# Patient Record
Sex: Female | Born: 1995 | Race: Black or African American | Hispanic: No | Marital: Married | Smoking: Never smoker
Health system: Southern US, Community
[De-identification: ages and names within clinical notes are randomized; demographics above are authoritative.]

## PROBLEM LIST (undated history)

## (undated) ENCOUNTER — Inpatient Hospital Stay: Payer: Self-pay

## (undated) DIAGNOSIS — D571 Sickle-cell disease without crisis: Secondary | ICD-10-CM

## (undated) DIAGNOSIS — Z789 Other specified health status: Secondary | ICD-10-CM

## (undated) HISTORY — DX: Sickle-cell disease without crisis: D57.1

---
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
## 2017-03-23 DIAGNOSIS — H5213 Myopia, bilateral: Secondary | ICD-10-CM | POA: Diagnosis not present

## 2018-02-12 ENCOUNTER — Other Ambulatory Visit: Payer: Self-pay

## 2018-02-12 ENCOUNTER — Encounter: Payer: Self-pay | Admitting: Emergency Medicine

## 2018-02-12 ENCOUNTER — Emergency Department: Payer: Self-pay

## 2018-02-12 ENCOUNTER — Emergency Department
Admission: EM | Admit: 2018-02-12 | Discharge: 2018-02-12 | Disposition: A | Discharge status: Home / Self Care | Payer: Self-pay | Attending: Emergency Medicine | Admitting: Emergency Medicine

## 2018-02-12 DIAGNOSIS — J101 Influenza due to other identified influenza virus with other respiratory manifestations: Secondary | ICD-10-CM

## 2018-02-12 LAB — INFLUENZA PANEL BY PCR (TYPE A & B)
INFLBPCR: POSITIVE — AB
Influenza A By PCR: NEGATIVE

## 2018-02-12 LAB — GROUP A STREP BY PCR: Group A Strep by PCR: NOT DETECTED

## 2018-02-12 MED ORDER — GUAIFENESIN-CODEINE 100-10 MG/5ML PO SOLN
5.0000 mL | Freq: Four times a day (QID) | ORAL | 0 refills | Status: DC | PRN
Start: 2018-02-12 — End: 2018-11-17

## 2018-02-12 NOTE — ED Triage Notes (Signed)
Pt presents to ED c/o sore throat, nasal congestion, and cough x3 days. States fiancee has same symptoms.

## 2018-02-12 NOTE — Discharge Instructions (Signed)
Follow-up with your primary care provider or [facility] care if any continued problems.  Began Robitussin-AC as needed for cough and congestion.  The time window for Tamiflu has already passed.  Take Tylenol or ibuprofen as needed for body aches or fever.  Increase fluids.

## 2018-02-12 NOTE — ED Provider Notes (Signed)
[facility] Emergency Department Provider Note  ____________________________________________   First MD Initiated Contact with Patient 02/12/18 1244     (approximate)  I have reviewed the triage vital signs and the nursing notes.   HISTORY  Chief Complaint Sore Throat and Cough   HPI Sharon Ball is a 22 y.o. female presents to the ED with complaint of sore throat, congestion, cough for the last 4 days.  Patient states that her fianc has same symptoms.  She has had subjective fever and body aches.  Patient denies any other symptoms.  She rates her pain as an 8 out of 10.   Past Medical History:  Diagnosis Date  . Medical history non-contributory     Patient Active Problem List   Diagnosis Date Noted  . Post term pregnancy 08/01/2016  . Indication for care in labor and delivery, antepartum 06/24/2016  . Pelvic pain affecting pregnancy 06/17/2016  . Sickle cell trait [facility](HCC)     Past Surgical History:  Procedure Laterality Date  . CESAREAN SECTION N/A 08/03/2016   Procedure: CESAREAN SECTION;  Surgeon: Christeen DouglasBeasley, Bethany, MD;  Location: [facility] ORS;  Service: Obstetrics;  Laterality: N/A;    Prior to Admission medications   Medication Sig Start Date End Date Taking? Authorizing Provider  acetaminophen (TYLENOL) 500 MG tablet Take 2 tablets (1,000 mg total) by mouth every 8 (eight) hours. 08/05/16   Ward, Elenora Fenderhelsea C, MD  guaiFENesin-codeine 100-10 MG/5ML syrup Take 5 mLs by mouth every 6 (six) hours as needed. 02/12/18   Tommi RumpsSummers, Rhonda L, PA-C  ibuprofen (ADVIL,MOTRIN) 600 MG tablet Take 1 tablet (600 mg total) by mouth every 6 (six) hours. 08/05/16   Ward, Elenora Fenderhelsea C, MD  oxyCODONE (OXY IR/ROXICODONE) 5 MG immediate release tablet Take 1 tablet (5 mg total) by mouth every 4 (four) hours as needed (pain scale 4-7). 08/05/16   Ward, Elenora Fenderhelsea C, MD  Prenatal Vit-Fe Fumarate-FA (MULTIVITAMIN-PRENATAL) 27-0.8 MG TABS tablet Take 1 tablet by mouth daily at 12  noon.    [provider]    Allergies Patient has no known allergies.  Family History  Problem Relation Age of Onset  . Sickle cell anemia Father   . Cancer Father   . Diabetes Maternal Grandmother     Social History Social History   Tobacco Use  . Smoking status: Never Smoker  . Smokeless tobacco: Never Used  Substance Use Topics  . Alcohol use: No  . Drug use: No    Review of Systems Constitutional: Subjective fever/chills Eyes: No visual changes. ENT: Positive sore throat.  Positive nasal congestion. Cardiovascular: Denies chest pain. Respiratory: Denies shortness of breath.  Positive cough. Gastrointestinal: No abdominal pain.  No nausea, no vomiting.  No diarrhea.  Musculoskeletal: Positive for muscle aches. Skin: Negative for rash. Neurological: Negative for headaches, focal weakness or numbness. ____________________________________________   PHYSICAL EXAM:  VITAL SIGNS: ED Triage Vitals  Enc Vitals Group     BP 02/12/18 1215 129/80     Pulse Rate 02/12/18 1215 (!) 117     Resp 02/12/18 1215 (!) 22     Temp 02/12/18 1215 100.2 F (37.9 C)     Temp Source 02/12/18 1215 Oral     SpO2 02/12/18 1215 99 %     Weight 02/12/18 1221 220 lb (99.8 kg)     Height 02/12/18 1221 5\' 8"  (1.727 m)     Head Circumference --      Peak Flow --  Pain Score 02/12/18 1221 8     Pain Loc --      Pain Edu? --      Excl. in GC? --    Constitutional: Alert and oriented. Well appearing and in no acute distress. Eyes: Conjunctivae are normal.  Head: Atraumatic. Nose: Mild congestion/rhinnorhea. Mouth/Throat: Mucous membranes are moist.  Oropharynx non-erythematous. Neck: No stridor.   Hematological/Lymphatic/Immunilogical: No cervical lymphadenopathy. Cardiovascular: Normal rate, regular rhythm. Grossly normal heart sounds.  Good peripheral circulation. Respiratory: Normal respiratory effort.  No retractions. Lungs CTAB. Gastrointestinal: Soft and nontender.  No distention.  Musculoskeletal: Moves upper and lower extremities without any difficulty.  Normal gait was noted. Neurologic:  Normal speech and language. No gross focal neurologic deficits are appreciated. No gait instability. Skin:  Skin is warm, dry and intact. No rash noted. Psychiatric: Mood and affect are normal. Speech and behavior are normal.  ____________________________________________   LABS (all labs ordered are listed, but only abnormal results are displayed)  Labs Reviewed  INFLUENZA PANEL BY PCR (TYPE A & B) - Abnormal; Notable for the following components:      Result Value   Influenza B By PCR POSITIVE (*)    All other components within normal limits  GROUP A STREP BY PCR    RADIOLOGY   Official radiology report(s): Dg Chest 2 View  Result Date: 02/12/2018 CLINICAL DATA:  Productive cough and sore throat for 3 days. EXAM: CHEST - 2 VIEW COMPARISON:  None. FINDINGS: The heart size and mediastinal contours are within normal limits. Both lungs are clear. No pleural effusion or pneumothorax. The visualized skeletal structures are unremarkable. IMPRESSION: Normal chest radiographs. Electronically Signed   By: Amie Portlandavid  Ormond M.D.   On: 02/12/2018 12:37  ____________________________________________   PROCEDURES  Procedure(s) performed: None  Procedures  Critical Care performed: No  ____________________________________________   INITIAL IMPRESSION / ASSESSMENT AND PLAN / ED COURSE  As part of my medical decision making, I reviewed the following data within the electronic MEDICAL RECORD NUMBER Notes from prior ED visits and [place] Controlled Substance Database  Patient presents to the ED with complaint of sore throat, cough, congestion and body aches for the last 4 days.  Patient states that her fianc has similar symptoms.  She is also had subjective fever.  Physical exam is consistent with viral respiratory symptoms.  Patient is positive for influenza B.  Patient is  aware that it is too late to start Tamiflu.  She was given a prescription for guaifenesin with codeine to take every 6 hours as needed for cough.  She will increase fluids and continue to take over-the-counter medication to control her fever and body aches.  She is encouraged to follow-up with her PCP or [facility] if any continued problems.  ____________________________________________   FINAL CLINICAL IMPRESSION(S) / ED DIAGNOSES  Final diagnoses:  Influenza B     ED Discharge Orders         Ordered    guaiFENesin-codeine 100-10 MG/5ML syrup  Every 6 hours PRN     02/12/18 1318           Note:  This document was prepared using Dragon voice recognition software and may include unintentional dictation errors.    Tommi RumpsSummers, Rhonda L, PA-C 02/12/18 1328    Emily FilbertWilliams, Jonathan E, MD 02/12/18 [phone]

## 2018-08-17 ENCOUNTER — Telehealth: Payer: Self-pay | Admitting: Family Medicine

## 2018-08-17 NOTE — Telephone Encounter (Signed)
TC with patient. Patient didn't understand the difference between ovulation tests and PT tests. Educated patient; verbalized understanding. Aileen Fass, RN

## 2018-08-31 DIAGNOSIS — E669 Obesity, unspecified: Secondary | ICD-10-CM

## 2018-08-31 DIAGNOSIS — Z6834 Body mass index (BMI) 34.0-34.9, adult: Secondary | ICD-10-CM

## 2018-11-17 ENCOUNTER — Encounter: Payer: Self-pay | Admitting: Physician Assistant

## 2018-11-17 ENCOUNTER — Ambulatory Visit (INDEPENDENT_AMBULATORY_CARE_PROVIDER_SITE_OTHER): Payer: Medicaid Other | Admitting: Physician Assistant

## 2018-11-17 ENCOUNTER — Other Ambulatory Visit: Payer: Self-pay

## 2018-11-17 ENCOUNTER — Other Ambulatory Visit (HOSPITAL_COMMUNITY)
Admission: RE | Admit: 2018-11-17 | Discharge: 2018-11-17 | Disposition: A | Discharge status: Home / Self Care | Payer: Medicaid Other | Source: Ambulatory Visit | Attending: Physician Assistant | Admitting: Physician Assistant

## 2018-11-17 VITALS — BP 115/84 | HR 166 | Temp 97.3°F | Resp 16 | Ht 62.0 in | Wt 224.0 lb

## 2018-11-17 DIAGNOSIS — Z7251 High risk heterosexual behavior: Secondary | ICD-10-CM | POA: Insufficient documentation

## 2018-11-17 DIAGNOSIS — Z113 Encounter for screening for infections with a predominantly sexual mode of transmission: Secondary | ICD-10-CM

## 2018-11-17 DIAGNOSIS — D573 Sickle-cell trait: Secondary | ICD-10-CM

## 2018-11-17 NOTE — Patient Instructions (Signed)
Pregnancy and Anemia  Anemia is a condition in which the concentration of red blood cells, or hemoglobin, in the blood is below normal. Hemoglobin is a substance in red blood cells that carries oxygen to the tissues of the body. Anemia results when enough oxygen does not reach these tissues. Anemia is common during pregnancy because the woman's body needs more blood volume and blood cells to provide nutrition to the fetus. The fetus needs iron and folic acid as it is developing. Your body may not produce enough red blood cells because of this. Also, during pregnancy, the liquid part of the blood (plasma) increases by about 30-50%, and the red blood cells increase by only 20%. This lowers the concentration of the red blood cells and creates a natural anemia-like situation. What are the causes? The most common cause of anemia during pregnancy is not having enough iron in the body to make red blood cells (iron deficiency anemia). Other causes may include:  Folic acid deficiency.  Vitamin B12 deficiency.  Certain prescription or over-the-counter medicines.  Certain medical conditions or infections that destroy red blood cells.  A low platelet count and bleeding caused by antibodies that go through the placenta to the fetus from the mother's blood. What are the signs or symptoms? Mild anemia may not be noticeable. If it becomes severe, symptoms may include:  Feeling tired (fatigue).  Shortness of breath, especially during activity.  Weakness.  Fainting.  Pale looking skin.  Headaches.  A fast or irregular heartbeat (palpitations).  Dizziness. How is this diagnosed? This condition may be diagnosed based on:  Your medical history and a physical exam.  Blood tests. How is this treated? Treatment for anemia during pregnancy depends on the cause of the anemia. Treatment can include:  Dietary changes.  Supplements of iron, vitamin B12, or folic acid.  A blood transfusion. This may  be needed if anemia is severe.  Hospitalization. This may be needed if there is a lot of blood loss or severe anemia. Follow these instructions at home:  Follow recommendations from your dietitian or health care provider about changing your diet.  Increase your vitamin C intake. This will help the stomach absorb more iron. Some foods that are high in vitamin C include: ? Oranges. ? Peppers. ? Tomatoes. ? Mangoes.  Eat a diet rich in iron. This would include foods such as: ? Liver. ? Beef. ? Eggs. ? Whole grains. ? Spinach. ? Dried fruit.  Take iron and vitamins as told by your health care provider.  Eat green leafy vegetables. These are a good source of folic acid.  Keep all follow-up visits as told by your health care provider. This is important. Contact a health care provider if:  You have frequent or lasting headaches.  You look pale.  You bruise easily. Get help right away if:  You have extreme weakness, shortness of breath, or chest pain.  You become dizzy or have trouble concentrating.  You have heavy vaginal bleeding.  You develop a rash.  You have bloody or black, tarry stools.  You faint.  You vomit up blood.  You vomit repeatedly.  You have abdominal pain.  You have a fever.  You are dehydrated. Summary  Anemia is a condition in which the concentration of red blood cells or hemoglobin in the blood is below normal.  Anemia is common during pregnancy because the woman's body needs more blood volume and blood cells to provide nutrition to the fetus.  The most   common cause of anemia during pregnancy is not having enough iron in the body to make red blood cells (iron deficiency anemia).  Mild anemia may not be noticeable. If it becomes severe, symptoms may include feeling tired and weak. This information is not intended to replace advice given to you by your health care provider. Make sure you discuss any questions you have with your health care  provider. Document Released: 02/01/2000 Document Revised: 05/28/2018 Document Reviewed: 03/11/2016 Elsevier Patient Education  2020 [employer]  

## 2018-11-17 NOTE — Progress Notes (Signed)
Patient: Sharon Ball, Female    DOB: 1995/11/19, 23 y.o.   MRN: [id number] Visit Date: 11/17/2018  Today's Provider: Trinna Post, PA-C   Chief Complaint  Patient presents with  . New Patient (Initial Visit)   Subjective:     Annual physical exam Sharon Ball is a 23 y.o. female who presents today for health maintenance and complete physical. She feels well. She reports exercising no. She reports she is sleeping well.  Lives with her soon to be husband and 23 year old daughter in [place]. 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  No medical issues she is being treated for.  Sexually active, not using birth control. Currently taking prenatal vitamin gummies. No smoking and no alcohol. She is trying to become pregnant.  Would like pregnancy test. Her LMP was 5 days ago. Has not taken home pregnancy.  -----------------------------------------------------------------   Review of Systems  Constitutional: Negative.   HENT: Negative.   Eyes: Negative.   Respiratory: Negative.   Cardiovascular: Negative.   Gastrointestinal: Negative.   Endocrine: Negative.   Genitourinary: Negative.   Musculoskeletal: Negative.   Skin: Negative.   Allergic/Immunologic: Negative.   Neurological: Negative.   Hematological: Negative.   Psychiatric/Behavioral: Negative.     Social History      She  reports that she has never smoked. She has never used smokeless tobacco. She reports that she does not drink alcohol or use drugs.       Social History   Socioeconomic History  . Marital status: Single    Spouse name: Not on file  . Number of children: Not on file  . Years of education: Not on file  . Highest education level: Not on file  Occupational History  . Not on file  Social Needs  . Financial resource strain: Not on file  . Food insecurity    Worry: Not on file    Inability: Not on file  . Transportation needs   Medical: Not on file    Non-medical: Not on file  Tobacco Use  . Smoking status: Never Smoker  . Smokeless tobacco: Never Used  Substance and Sexual Activity  . Alcohol use: No  . Drug use: No  . Sexual activity: Yes  Lifestyle  . Physical activity    Days per week: Not on file    Minutes per session: Not on file  . Stress: Not on file  Relationships  . Social Herbalist on phone: Not on file    Gets together: Not on file    Attends religious service: Not on file    Active member of club or organization: Not on file    Attends meetings of clubs or organizations: Not on file    Relationship status: Not on file  Other Topics Concern  . Not on file  Social History Narrative  . Not on file    Past Medical History:  Diagnosis Date  . Medical history non-contributory   . Sickle cell anemia [facility])      Patient Active Problem List   Diagnosis Date Noted  . Obesity, unspecified 12/24/2016  . Sickle cell trait [facility])     Past Surgical History:  Procedure Laterality Date  . CESAREAN SECTION N/A 08/03/2016   Procedure: CESAREAN SECTION;  Surgeon: Benjaman Kindler, MD;  Location: [facility] ORS;  Service: Obstetrics;  Laterality: N/A;    Family  History        Family Status  Relation Name Status  . Father  Deceased at age 55's  . MGM  (Not Specified)  . Sister  Alive  . Brother  Alive        Her family history includes Cancer in her father; Diabetes in her maternal grandmother; Sickle cell anemia in her father.      No Known Allergies  No current outpatient medications on file.   Patient Care Team: Department, [facility] as PCP - General    Objective:    Vitals: BP 115/84 (BP Location: Left Arm, Patient Position: Sitting, Cuff Size: Large)   Pulse (!) 166   Temp (!) 97.3 F (36.3 C) (Temporal)   Resp 16   Ht 5\' 2"  (1.575 m)   Wt 224 lb (101.6 kg)   LMP 11/12/2018 (Exact Date)   BMI 40.97 kg/m    Vitals:   11/17/18 1503  BP: 115/84   Pulse: (!) 166  Resp: 16  Temp: (!) 97.3 F (36.3 C)  TempSrc: Temporal  Weight: 224 lb (101.6 kg)  Height: 5\' 2"  (1.575 m)     Physical Exam Constitutional:      Appearance: Normal appearance.  Cardiovascular:     Rate and Rhythm: Normal rate and regular rhythm.     Heart sounds: Normal heart sounds.  Pulmonary:     Effort: Pulmonary effort is normal.     Breath sounds: Normal breath sounds.  Abdominal:     General: Bowel sounds are normal.     Palpations: Abdomen is soft.  Skin:    General: Skin is warm and dry.  Neurological:     Mental Status: She is alert and oriented to person, place, and time. Mental status is at baseline.  Psychiatric:        Mood and Affect: Mood normal.        Behavior: Behavior normal.      Depression Screen PHQ 2/9 Scores 11/17/2018  PHQ - 2 Score 2  PHQ- 9 Score 8       Assessment & Plan:     Routine Health Maintenance and Physical Exam  Exercise Activities and Dietary recommendations Goals   None     Immunization History  Administered Date(s) Administered  . Tdap 08/05/2016    Health Maintenance  Topic Date Due  . CHLAMYDIA SCREENING  01/29/2013  . HIV Screening  01/29/2013  . INFLUENZA VACCINE  09/18/2018  . TETANUS/TDAP  08/06/2026     Discussed health benefits of physical activity, and encouraged her to engage in regular exercise appropriate for her age and condition.    1. Routine screening for STI (sexually transmitted infection)  - POCT urine pregnancy  2. Unprotected sex  Counseled she is very unlikely to be pregnant based on her LMP. She would still like to pursue pregnancy test.  - Urine cytology ancillary only - B-HCG Quant - Beta HCG, Quant  3. Sickle cell trait (HCC)  The entirety of the information documented in the History of Present Illness, Review of Systems and Physical Exam were personally obtained by me. Portions of this information were initially documented by 11/18/2018, CMA and  reviewed by me for thoroughness and accuracy.   F/u 4 months for PAP  --------------------------------------------------------------------    08/08/2026, PA-C  [facility]

## 2018-11-18 LAB — BETA HCG QUANT (REF LAB): hCG Quant: <1 m[IU]/mL

## 2018-11-22 LAB — URINE CYTOLOGY ANCILLARY ONLY
Chlamydia: NEGATIVE
Neisseria Gonorrhea: NEGATIVE

## 2018-12-28 ENCOUNTER — Ambulatory Visit (INDEPENDENT_AMBULATORY_CARE_PROVIDER_SITE_OTHER): Payer: Medicaid Other

## 2018-12-28 ENCOUNTER — Other Ambulatory Visit: Payer: Self-pay

## 2018-12-28 DIAGNOSIS — Z23 Encounter for immunization: Secondary | ICD-10-CM

## 2019-06-04 ENCOUNTER — Ambulatory Visit: Payer: Medicaid Other

## 2019-06-10 ENCOUNTER — Ambulatory Visit: Payer: Medicaid Other | Attending: Internal Medicine

## 2019-06-10 DIAGNOSIS — Z23 Encounter for immunization: Secondary | ICD-10-CM

## 2019-06-10 NOTE — Progress Notes (Signed)
   Covid-19 Vaccination Clinic  Name:  TAKEIA CIARAVINO    MRN: [id number] DOB: 08/27/1995  06/10/2019  Ms. Hon was observed post Covid-19 immunization for 15 minutes without incident. She was provided with Vaccine Information Sheet and instruction to access the V-Safe system.   Ms. Mancino was instructed to call 911 with any severe reactions post vaccine: Marland Kitchen Difficulty breathing  . Swelling of face and throat  . A fast heartbeat  . A bad rash all over body  . Dizziness and weakness   Immunizations Administered    Name Date Dose VIS Date Route   Pfizer COVID-19 Vaccine 06/10/2019 11:27 AM 0.3 mL 04/13/2018 Intramuscular   Manufacturer: [employer], [employer]   Lot: [id number]   NDC: [address]-8

## 2019-06-26 ENCOUNTER — Encounter: Payer: Self-pay | Admitting: Physician Assistant

## 2019-07-05 ENCOUNTER — Ambulatory Visit: Payer: Medicaid Other | Attending: Internal Medicine

## 2019-07-05 ENCOUNTER — Ambulatory Visit: Payer: Medicaid Other

## 2019-07-05 DIAGNOSIS — Z23 Encounter for immunization: Secondary | ICD-10-CM

## 2019-07-05 NOTE — Progress Notes (Signed)
   Covid-19 Vaccination Clinic  Name:  Sharon Ball    MRN: [id number] DOB: 1995/03/19  07/05/2019  [name] was observed post Covid-19 immunization for 15 minutes without incident. She was provided with Vaccine Information Sheet and instruction to access the V-Safe system.   [name] was instructed to call 911 with any severe reactions post vaccine: Marland Kitchen Difficulty breathing  . Swelling of face and throat  . A fast heartbeat  . A bad rash all over body  . Dizziness and weakness   Immunizations Administered    Name Date Dose VIS Date Route   Pfizer COVID-19 Vaccine 07/05/2019 12:25 PM 0.3 mL 04/13/2018 Intramuscular   Manufacturer: [employer], [employer]   Lot: [id number]   NDC: [address]-1

## 2019-07-06 ENCOUNTER — Ambulatory Visit: Payer: Medicaid Other | Admitting: Physician Assistant

## 2019-07-06 NOTE — Progress Notes (Deleted)
     Established patient visit   Patient: Sharon Ball   DOB: December 07, 1995   23 y.o. Female  MRN: [id number] Visit Date: 07/06/2019  Today's healthcare provider: Trey Sailors, PA-C   No chief complaint on file.  Subjective    HPI     {Show patient history (optional):23778::" "}   Medications: No outpatient medications prior to visit.   No facility-administered medications prior to visit.    Review of Systems  {CBC  CMET  Lipids  Results Review (optional):23779::" "}  Objective    There were no vitals taken for this visit. {Show previous vital signs (optional):23777::" "}  Physical Exam  ***  No results found for any visits on 07/06/19.  Assessment & Plan     ***  No follow-ups on file.      {provider attestation***:1}   Maryella Shivers  [facility] [phone] (phone) [phone] (fax)  [facility]

## 2019-07-09 ENCOUNTER — Ambulatory Visit: Payer: Medicaid Other

## 2019-10-21 ENCOUNTER — Ambulatory Visit: Payer: Medicaid Other | Admitting: Physician Assistant

## 2019-10-21 ENCOUNTER — Ambulatory Visit: Payer: Self-pay | Admitting: Physician Assistant

## 2019-10-21 DIAGNOSIS — Z03818 Encounter for observation for suspected exposure to other biological agents ruled out: Secondary | ICD-10-CM | POA: Diagnosis not present

## 2019-10-21 DIAGNOSIS — Z1152 Encounter for screening for COVID-19: Secondary | ICD-10-CM | POA: Diagnosis not present

## 2019-10-25 ENCOUNTER — Ambulatory Visit: Payer: Self-pay | Admitting: Physician Assistant

## 2020-02-22 ENCOUNTER — Telehealth: Payer: Self-pay

## 2020-02-22 NOTE — Telephone Encounter (Signed)
Copied from CRM [id number]. Topic: General - Other >> Feb 22, 2020  1:14 PM Jaquita Rector A wrote: Reason for CRM: Patient called in wanting to come in this afternoon for a TB skin test stated that she got a new job and they need her tested first. Please call her to schedule at Ph# [phone]

## 2020-02-22 NOTE — Telephone Encounter (Signed)
Hasn't been seen in > 1 year. She is due for a physical with PAP and may get her tb test then. Or can schedule with health department. We don't place PPDs we get TB gold.

## 2020-02-23 NOTE — Telephone Encounter (Addendum)
Called patient and she states that she would have to schedule a physical. She states she can only come in tomorrow to have a TB gold. She is going to check with her job to see if she need to have the PPDs or if the TB gold is fine.

## 2020-02-24 ENCOUNTER — Ambulatory Visit: Payer: Medicaid Other | Admitting: Physician Assistant

## 2020-02-24 DIAGNOSIS — Z111 Encounter for screening for respiratory tuberculosis: Secondary | ICD-10-CM | POA: Diagnosis not present

## 2020-02-26 DIAGNOSIS — Z23 Encounter for immunization: Secondary | ICD-10-CM | POA: Diagnosis not present

## 2020-02-26 DIAGNOSIS — Z111 Encounter for screening for respiratory tuberculosis: Secondary | ICD-10-CM | POA: Diagnosis not present

## 2020-02-27 IMAGING — CR DG CHEST 2V
1 series · 2 of 2 positions shown · non-contrast
Comparison: None.

CLINICAL DATA: Productive cough and sore throat for 3 days.

EXAM:
CHEST - 2 VIEW

[Series 1: dg chest 2 view · 0.14mm/px · 2 of 2 slices shown]
[im 1/2]
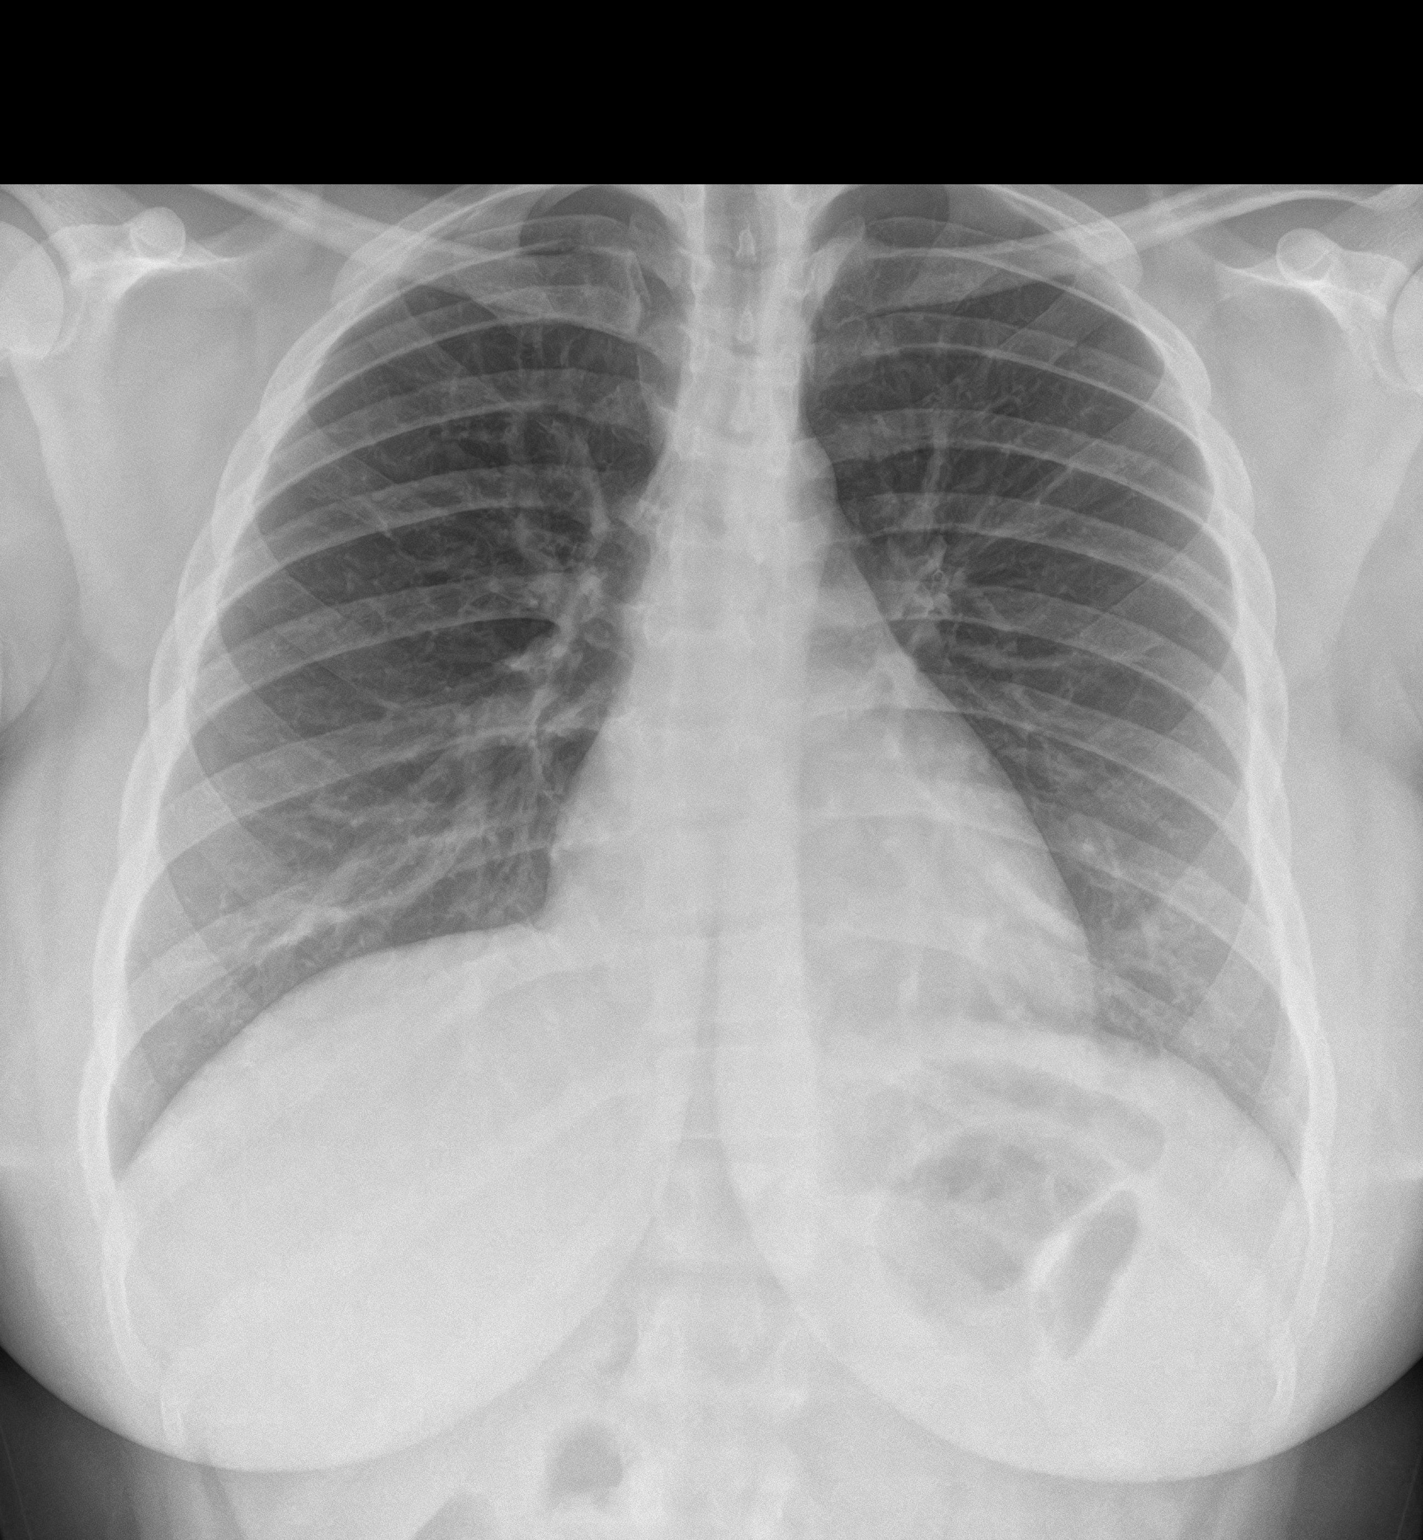
[im 2/2]
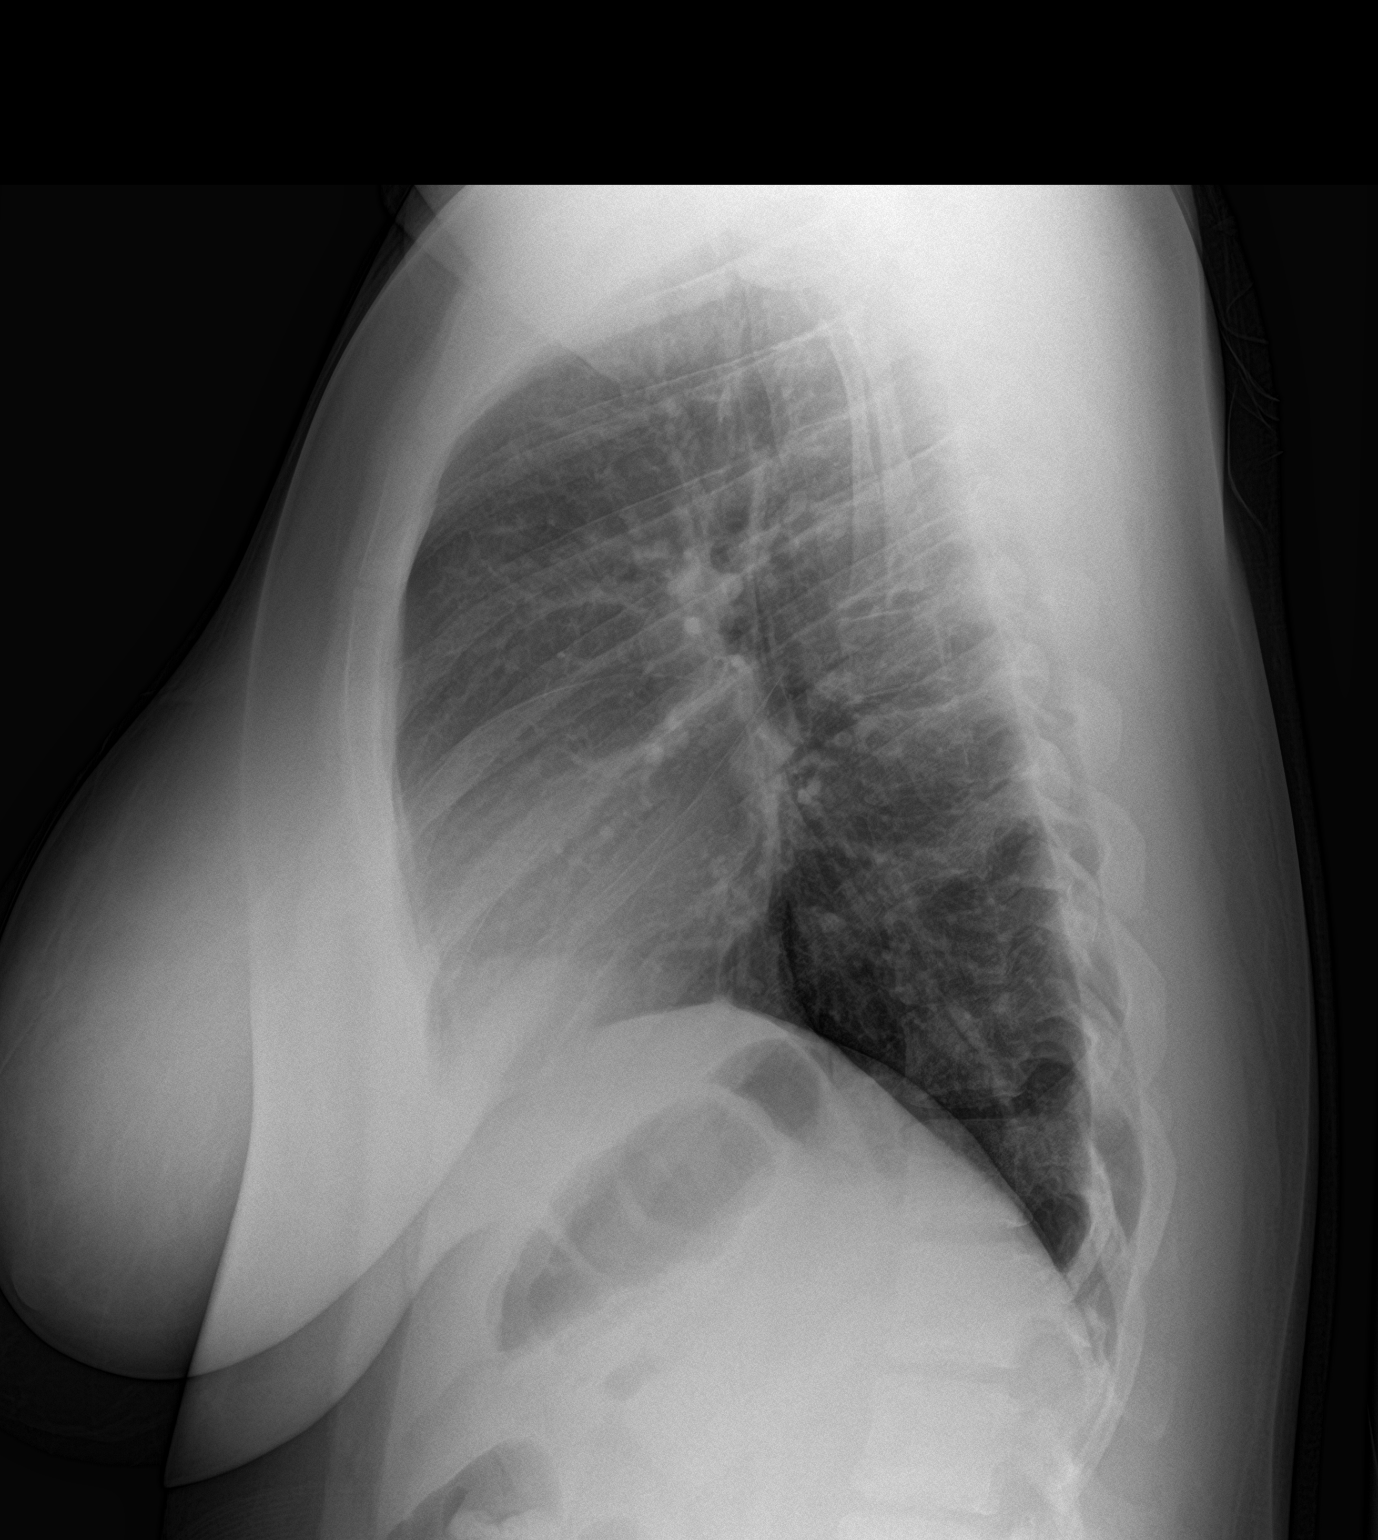

[2 of 2 positions shown; findings below may reference images not displayed]

FINDINGS: The heart size and mediastinal contours are within normal limits.
Both lungs are clear. No pleural effusion or pneumothorax. The
visualized skeletal structures are unremarkable.
IMPRESSION: Normal chest radiographs.

## 2021-03-17 ENCOUNTER — Other Ambulatory Visit: Payer: Self-pay

## 2021-03-17 ENCOUNTER — Emergency Department
Admission: EM | Admit: 2021-03-17 | Discharge: 2021-03-17 | Disposition: A | Discharge status: Home / Self Care | Payer: Medicaid Other | Attending: Emergency Medicine | Admitting: Emergency Medicine

## 2021-03-17 ENCOUNTER — Encounter: Payer: Self-pay | Admitting: Emergency Medicine

## 2021-03-17 DIAGNOSIS — J020 Streptococcal pharyngitis: Secondary | ICD-10-CM | POA: Diagnosis not present

## 2021-03-17 DIAGNOSIS — Z20822 Contact with and (suspected) exposure to covid-19: Secondary | ICD-10-CM | POA: Diagnosis not present

## 2021-03-17 DIAGNOSIS — J029 Acute pharyngitis, unspecified: Secondary | ICD-10-CM | POA: Diagnosis present

## 2021-03-17 LAB — RESP PANEL BY RT-PCR (FLU A&B, COVID) ARPGX2
Influenza A by PCR: NEGATIVE
Influenza B by PCR: NEGATIVE
SARS Coronavirus 2 by RT PCR: NEGATIVE

## 2021-03-17 LAB — GROUP A STREP BY PCR: Group A Strep by PCR: DETECTED — AB

## 2021-03-17 MED ORDER — AMOXICILLIN 875 MG PO TABS: 875.0000 mg | ORAL_TABLET | Freq: Two times a day (BID) | ORAL | 0 refills | Status: DC

## 2021-03-17 MED ORDER — AMOXICILLIN 500 MG PO CAPS
500.0000 mg | ORAL_CAPSULE | Freq: Once | ORAL | Status: AC
  Administered 2021-03-17: 500 mg via ORAL
  Filled 2021-03-17: qty 1

## 2021-03-17 NOTE — Discharge Instructions (Addendum)
Follow-up with your primary care provider if any continued problems or concerns.  If not improving or getting worse he should call make an appoint with Dr.Juengel who is on-call for [facility].  Increase fluids to stay hydrated.  Begin taking Amoxil 875 twice daily for the next 10 days even if you begin feeling much better.  You are contagious for 24 hours so someone in your family may also end up getting it.  You may take Tylenol or ibuprofen as needed for throat pain.

## 2021-03-17 NOTE — ED Triage Notes (Signed)
Pt reports fever, sore throat, cough, nasal congestion and bodyaches for 2 days. Pt denies recent exposures.

## 2021-03-17 NOTE — ED Provider Notes (Signed)
[facility] Provider Note    Event Date/Time   First MD Initiated Contact with Patient 03/17/21 [phone]     (approximate)   History   Sore Throat, Cough, Nasal Congestion, and Fever   HPI  Sharon Ball is a 26 y.o. female   presents to the ED with complaint of fever, sore throat, cough, nasal congestion and body aches for the last 2 days.  Patient is unaware of any known COVID or influenza exposure.  Patient has a history of sickle cell trait and is a non-smoker.  Rates her pain as a 7 out of 10.      Physical Exam   Triage Vital Signs: ED Triage Vitals  Enc Vitals Group     BP 03/17/21 0900 127/71     Pulse Rate 03/17/21 0900 67     Resp 03/17/21 0900 18     Temp 03/17/21 0900 98.5 F (36.9 C)     Temp Source 03/17/21 0900 Oral     SpO2 03/17/21 0900 100 %     Weight 03/17/21 0846 224 lb 13.9 oz (102 kg)     Height 03/17/21 0846 5\' 2"  (1.575 m)     Head Circumference --      Peak Flow --      Pain Score 03/17/21 0845 7     Pain Loc --      Pain Edu? --      Excl. in [place]? --     Most recent vital signs: Vitals:   03/17/21 0900  BP: 127/71  Pulse: 67  Resp: 18  Temp: 98.5 F (36.9 C)  SpO2: 100%     General: Awake, no distress.  CV:  Good peripheral perfusion.  Heart regular rate and rhythm without murmur. Resp:  Normal effort.  Lungs are clear bilaterally. Abd:  No distention.  Other:  On examination of the posterior pharynx there is some erythema but no exudates seen.  Uvula is midline.  Patient is able to maintain secretions without any difficulty and was able to also swallow and amoxicillin capsule while in the ED without difficulty.  Neck is supple with minimal cervical lymphadenopathy.   ED Results / Procedures / Treatments   Labs (all labs ordered are listed, but only abnormal results are displayed) Labs Reviewed  GROUP A STREP BY PCR - Abnormal; Notable for the following components:      Result Value   Group A Strep  by PCR DETECTED (*)    All other components within normal limits  RESP PANEL BY RT-PCR (FLU A&B, COVID) [id number]      PROCEDURES:  Critical Care performed:   Procedures   MEDICATIONS ORDERED IN ED: Medications  amoxicillin (AMOXIL) capsule 500 mg (500 mg Oral Given 03/17/21 1053)     IMPRESSION / MDM / [place] / ED COURSE  I reviewed the triage vital signs and the nursing notes.   Differential diagnosis includes, but is not limited to, COVID, influenza, strep pharyngitis, tonsillitis, peritonsillar cellulitis/abscess, upper respiratory infection.  26 year old female presents to the ED with 2 days of sore throat and upper respiratory symptoms along with body ache and fever.  Patient has a history of sickle cell trait and is a non-smoker.  Lab work was reviewed and patient was made aware that the COVID and influenza test was negative.  Patient is positive for strep pharyngitis and was made aware.  Patient was able to swallow and amoxicillin tablet while in the  ED and continues to drink fluids without any difficulty.  A prescription for amoxicillin 875 twice daily was sent to her pharmacy and she may continue taking Tylenol or ibuprofen as needed for throat pain.  She is encouraged to drink fluids to stay hydrated.  Follow-up with her PCP or if not improving or worsening she is to follow-up with Dr. Kathyrn Sheriff, who is on-call for [facility].        FINAL CLINICAL IMPRESSION(S) / ED DIAGNOSES   Final diagnoses:  Strep pharyngitis     Rx / DC Orders   ED Discharge Orders          Ordered    amoxicillin (AMOXIL) 875 MG tablet  2 times daily        03/17/21 1058             Note:  This document was prepared using Dragon voice recognition software and may include unintentional dictation errors.   Johnn Hai, PA-C 03/17/21 1120    Vanessa [place], MD 03/17/21 1228

## 2021-03-17 NOTE — ED Notes (Signed)
Pt presents to ED for cough, fever, and sore throat for the past 2 days. Denies any exposure to COVID or flu. Pt has not actually taken her temp but states she feels hot. Pt is A&Ox4 and ambulatory to the room.

## 2021-03-19 ENCOUNTER — Telehealth: Payer: Self-pay

## 2021-03-19 NOTE — Telephone Encounter (Signed)
Transition Care Management Follow-up Telephone Call Date of discharge and from where: 03/17/2021-[facility] How have you been since you were released from the hospital? Pt stated she is doing fine.  Any questions or concerns? No  Items Reviewed: Did the pt receive and understand the discharge instructions provided? Yes  Medications obtained and verified? Yes  Other? No  Any new allergies since your discharge? No  Dietary orders reviewed? No Do you have support at home? Yes   Home Care and Equipment/Supplies: Were home health services ordered? not applicable If so, what is the name of the agency? N/A  Has the agency set up a time to come to the patient's home? not applicable Were any new equipment or medical supplies ordered?  No What is the name of the medical supply agency? N/A Were you able to get the supplies/equipment? not applicable Do you have any questions related to the use of the equipment or supplies? No  Functional Questionnaire: (I = Independent and D = Dependent) ADLs: I  Bathing/Dressing- I  Meal Prep- I  Eating- I  Maintaining continence- I  Transferring/Ambulation- I  Managing Meds- I  Follow up appointments reviewed:  PCP Hospital f/u appt confirmed? No   Specialist Hospital f/u appt confirmed? No   Are transportation arrangements needed? No  If their condition worsens, is the pt aware to call PCP or go to the Emergency Dept.? Yes Was the patient provided with contact information for the PCP's office or ED? Yes Was to pt encouraged to call back with questions or concerns? Yes

## 2022-12-03 DIAGNOSIS — Z111 Encounter for screening for respiratory tuberculosis: Secondary | ICD-10-CM | POA: Diagnosis not present

## 2022-12-03 DIAGNOSIS — Z23 Encounter for immunization: Secondary | ICD-10-CM | POA: Diagnosis not present

## 2022-12-05 DIAGNOSIS — Z0279 Encounter for issue of other medical certificate: Secondary | ICD-10-CM | POA: Diagnosis not present

## 2022-12-05 DIAGNOSIS — Z23 Encounter for immunization: Secondary | ICD-10-CM | POA: Diagnosis not present

## 2022-12-05 DIAGNOSIS — Z111 Encounter for screening for respiratory tuberculosis: Secondary | ICD-10-CM | POA: Diagnosis not present

## 2023-01-05 ENCOUNTER — Encounter: Payer: Self-pay | Admitting: Emergency Medicine

## 2023-01-05 ENCOUNTER — Other Ambulatory Visit: Payer: Self-pay

## 2023-01-05 DIAGNOSIS — R220 Localized swelling, mass and lump, head: Secondary | ICD-10-CM | POA: Diagnosis present

## 2023-01-05 DIAGNOSIS — K047 Periapical abscess without sinus: Secondary | ICD-10-CM | POA: Diagnosis not present

## 2023-01-05 DIAGNOSIS — K029 Dental caries, unspecified: Secondary | ICD-10-CM | POA: Insufficient documentation

## 2023-01-05 NOTE — ED Triage Notes (Signed)
Patient ambulatory to triage with complaints of left lower dental pain. States she knows she has infected tooth with appt on the 26th but states she cannot stand the pain tonight and is concerned for abscessed tooth now.

## 2023-01-06 ENCOUNTER — Emergency Department
Admission: EM | Admit: 2023-01-06 | Discharge: 2023-01-06 | Disposition: A | Discharge status: Home / Self Care | Payer: Medicaid Other | Attending: Emergency Medicine | Admitting: Emergency Medicine

## 2023-01-06 DIAGNOSIS — K047 Periapical abscess without sinus: Secondary | ICD-10-CM

## 2023-01-06 DIAGNOSIS — K029 Dental caries, unspecified: Secondary | ICD-10-CM

## 2023-01-06 DIAGNOSIS — K0889 Other specified disorders of teeth and supporting structures: Secondary | ICD-10-CM

## 2023-01-06 MED ORDER — KETOROLAC TROMETHAMINE 30 MG/ML IJ SOLN
30.0000 mg | Freq: Once | INTRAMUSCULAR | Status: AC
  Administered 2023-01-06: 30 mg via INTRAMUSCULAR
  Filled 2023-01-06: qty 1

## 2023-01-06 MED ORDER — AMOXICILLIN 500 MG PO TABS: 500.0000 mg | ORAL_TABLET | Freq: Two times a day (BID) | ORAL | 0 refills | Status: AC

## 2023-01-06 MED ORDER — AMOXICILLIN 500 MG PO CAPS
500.0000 mg | ORAL_CAPSULE | Freq: Once | ORAL | Status: AC
  Administered 2023-01-06: 500 mg via ORAL
  Filled 2023-01-06: qty 1

## 2023-01-06 NOTE — Discharge Instructions (Addendum)
Please take Tylenol and ibuprofen/Advil for your pain.  It is safe to take them together, or to alternate them every few hours.  Take up to 1000mg of Tylenol at a time, up to 4 times per day.  Do not take more than 4000 mg of Tylenol in 24 hours.  For ibuprofen, take 400-600 mg, 3 - 4 times per day.  

## 2023-01-06 NOTE — ED Provider Notes (Signed)
[facility] Provider Note    Event Date/Time   First MD Initiated Contact with Patient 01/06/23 [phone]     (approximate)   History   Dental Pain   HPI  Sharon Ball is a 27 y.o. female who presents to the ED for evaluation of Dental Pain   Patient presents with a week of progressively worsening left-sided mandibular dental pain.  Has a dentist appointment coming up in about 1 week.  Reports sensation of mild fullness/swelling to her left mandible.  No trauma.  No fevers, systemic symptoms, difficulty breathing or swallowing.   Physical Exam   Triage Vital Signs: ED Triage Vitals  Encounter Vitals Group     BP 01/05/23 2252 132/76     Systolic BP Percentile --      Diastolic BP Percentile --      Pulse Rate 01/05/23 2252 87     Resp 01/05/23 2252 18     Temp 01/05/23 2252 98.6 F (37 C)     Temp Source 01/05/23 2252 Oral     SpO2 01/05/23 2252 100 %     Weight 01/05/23 2251 225 lb (102.1 kg)     Height 01/05/23 2251 5\' 2"  (1.575 m)     Head Circumference --      Peak Flow --      Pain Score 01/05/23 2251 10     Pain Loc --      Pain Education --      Exclude from Growth Chart --     Most recent vital signs: Vitals:   01/05/23 2252  BP: 132/76  Pulse: 87  Resp: 18  Temp: 98.6 F (37 C)  SpO2: 100%    General: Awake, no distress.  CV:  Good peripheral perfusion.  Resp:  Normal effort.  Abd:  No distention.  MSK:  No deformity noted.  Neuro:  No focal deficits appreciated. Other:  Fracture left mandibular molar without discrete signs of abscess to require I&D.  No signs of PTA or upper airway obstruction   ED Results / Procedures / Treatments   Labs (all labs ordered are listed, but only abnormal results are displayed) Labs Reviewed - No data to display  EKG   RADIOLOGY   Official radiology report(s): No results found.  PROCEDURES and INTERVENTIONS:  Procedures  Medications  amoxicillin (AMOXIL) capsule 500 mg  (500 mg Oral Given 01/06/23 0116)  ketorolac (TORADOL) 30 MG/ML injection 30 mg (30 mg Intramuscular Given 01/06/23 0117)     IMPRESSION / MDM / ASSESSMENT AND PLAN / ED COURSE  I reviewed the triage vital signs and the nursing notes.  Differential diagnosis includes, but is not limited to, abscess, fractured tooth, Ludwig's angina,  {Patient presents with symptoms of an acute illness or injury that is potentially life-threatening.  Patient presents with dental pain suitable for outpatient management with dental follow-up.  We will cover for infection with amoxicillin but no clear abscess to require I&D, no airway concerns.  Suitable for outpatient management.      FINAL CLINICAL IMPRESSION(S) / ED DIAGNOSES   Final diagnoses:  Dental caries  Pain, dental  Dental infection     Rx / DC Orders   ED Discharge Orders          Ordered    amoxicillin (AMOXIL) 500 MG tablet  2 times daily        01/06/23 0105  Note:  This document was prepared using Dragon voice recognition software and may include unintentional dictation errors.   Delton [place], MD 01/06/23 [phone]

## 2023-11-06 ENCOUNTER — Ambulatory Visit (INDEPENDENT_AMBULATORY_CARE_PROVIDER_SITE_OTHER): Admitting: Physician Assistant

## 2023-11-06 ENCOUNTER — Encounter: Payer: Self-pay | Admitting: Physician Assistant

## 2023-11-06 ENCOUNTER — Ambulatory Visit: Admitting: Physician Assistant

## 2023-11-06 VITALS — BP 115/73 | HR 84 | Temp 98.6°F | Ht 62.0 in | Wt 219.0 lb

## 2023-11-06 DIAGNOSIS — E66811 Obesity, class 1: Secondary | ICD-10-CM

## 2023-11-06 DIAGNOSIS — G4489 Other headache syndrome: Secondary | ICD-10-CM | POA: Diagnosis not present

## 2023-11-06 DIAGNOSIS — Z7689 Persons encountering health services in other specified circumstances: Secondary | ICD-10-CM

## 2023-11-06 NOTE — Progress Notes (Unsigned)
 New patient visit  Patient: Sharon Ball   DOB: 1995-06-01   27 y.o. Female  MRN: [id number] Visit Date: 11/06/2023  Today's healthcare provider: Blessed Cotham, PA-C   Chief Complaint  Patient presents with   Headache    Patient has been having headaches almost daily for about 4 weeks.  She states it starts over her left eye and if she has any sudden movement it moves to the back of her head at the top of her neck and she feels a heartbeat in that area.  Over the counter medications have not helped.  She has no history of headaches like these.  She states that she is at times sensitive to the light and has to move around in the dark.  She also reports somse blurred and/or double vision    Subjective    Sharon Ball is a 27 y.o. female who presents today as a new patient to establish care.   Discussed the use of AI scribe software for clinical note transcription with the patient, who gave verbal consent to proceed.  History of Present Illness     Past Medical History:  Diagnosis Date   Medical history non-contributory    Sickle cell anemia (HCC)    Past Surgical History:  Procedure Laterality Date   CESAREAN SECTION N/A 08/03/2016   Procedure: CESAREAN SECTION;  Surgeon: Verdon Keen, MD;  Location: [facility] ORS;  Service: Obstetrics;  Laterality: N/A;   Family Status  Relation Name Status   Father  Deceased at age 18's   MGM  (Not Specified)   Sister  Alive   Brother  Alive  No partnership data on file   Family History  Problem Relation Age of Onset   Sickle cell anemia Father    Cancer Father    Diabetes Maternal Grandmother    Social History   Socioeconomic History   Marital status: Married    Spouse name: Not on file   Number of children: Not on file   Years of education: Not on file   Highest education level: Not on file  Occupational History   Not on file  Tobacco Use   Smoking status: Never   Smokeless tobacco: Never  Substance and Sexual  Activity   Alcohol use: No   Drug use: No   Sexual activity: Not Currently  Other Topics Concern   Not on file  Social History Narrative   Not on file   Social Drivers of Health   Financial Resource Strain: Not on file  Food Insecurity: Not on file  Transportation Needs: Not on file  Physical Activity: Not on file  Stress: Not on file  Social Connections: Not on file   Outpatient Medications Prior to Visit  Medication Sig   amoxicillin  (AMOXIL ) 875 MG tablet Take 1 tablet (875 mg total) by mouth 2 (two) times daily. (Patient not taking: Reported on 11/06/2023)   No facility-administered medications prior to visit.   No Known Allergies  Immunization History  Administered Date(s) Administered   Influenza,inj,Quad PF,6+ Mos 12/28/2018   Moderna Covid-19 Fall Seasonal Vaccine 23yrs & older 12/05/2022   PFIZER(Purple Top)SARS-COV-2 Vaccination 06/10/2019, 07/05/2019   Tdap 08/05/2016    Health Maintenance  Topic Date Due   HPV VACCINES (1 - 3-dose series) Never done   HIV Screening  Never done   Hepatitis C Screening  Never done   Pneumococcal Vaccine (1 of 2 - PCV) Never done   Hepatitis B Vaccines 19-59 Average Risk (  1 of 3 - 19+ 3-dose series) Never done   Cervical Cancer Screening (Pap smear)  Never done   Influenza Vaccine  09/18/2023   DTaP/Tdap/Td (2 - Td or Tdap) 08/06/2026   COVID-19 Vaccine  Completed   Meningococcal B Vaccine  Aged Out    Patient Care Team: Ashok Kathrine CHRISTELLA DEVONNA as PCP - General (Physician Assistant)  Review of Systems Except see HPI   {Insert previous labs (optional):23779} {See past labs  Heme  Chem  Endocrine  Serology  Results Review (optional):1}   Objective    BP 123/76 (BP Location: Right Arm, Patient Position: Sitting, Cuff Size: Large)   Pulse 82   Temp 98.6 F (37 C) (Oral)   Ht 5' 2 (1.575 m)   Wt 219 lb (99.3 kg)   SpO2 99%   BMI 40.06 kg/m  {Insert last BP/Wt (optional):23777}{See vitals history  (optional):1}   Physical Exam  Depression Screen    11/06/2023    2:35 PM 11/17/2018    2:57 PM  PHQ 2/9 Scores  PHQ - 2 Score 3 2  PHQ- 9 Score 5 8   No results found for any visits on 11/06/23.  Assessment & Plan     *** Assessment and Plan Assessment & Plan      Encounter to establish care Welcomed to our clinic Reviewed past medical hx, social hx, family hx and surgical hx Pt advised to send all vaccination records or screening   No follow-ups on file.    The patient was advised to call back or seek an in-person evaluation if the symptoms worsen or if the condition fails to improve as anticipated.  I discussed the assessment and treatment plan with the patient. The patient was provided an opportunity to ask questions and all were answered. The patient agreed with the plan and demonstrated an understanding of the instructions.  I, Rupal Childress, PA-C have reviewed all documentation for this visit. The documentation on  11/06/2023   for the exam, diagnosis, procedures, and orders are all accurate and complete.  Jolynn Spencer, [facility], MMS [facility] [phone] (phone) [phone] (fax)  [facility]

## 2023-11-07 LAB — LIPID PANEL
Chol/HDL Ratio: 3.4 ratio (ref 0.0–4.4)
Cholesterol, Total: 171 mg/dL (ref 100–199)
HDL: 51 mg/dL (ref >39)
LDL Chol Calc (NIH): 107 mg/dL — ABNORMAL HIGH (ref 0–99)
Triglycerides: 69 mg/dL (ref 0–149)
VLDL Cholesterol Cal: 13 mg/dL (ref 5–40)

## 2023-11-07 LAB — CBC WITH DIFFERENTIAL/PLATELET
Basophils Absolute: 0.1 x10E3/uL (ref 0.0–0.2)
Basos: 1 %
EOS (ABSOLUTE): 0.1 x10E3/uL (ref 0.0–0.4)
Eos: 1 %
Hematocrit: 32.7 % — ABNORMAL LOW (ref 34.0–46.6)
Hemoglobin: 9.4 g/dL — ABNORMAL LOW (ref 11.1–15.9)
Immature Grans (Abs): 0 x10E3/uL (ref 0.0–0.1)
Immature Granulocytes: 0 %
Lymphocytes Absolute: 2.1 x10E3/uL (ref 0.7–3.1)
Lymphs: 23 %
MCH: 20.7 pg — ABNORMAL LOW (ref 26.6–33.0)
MCHC: 28.7 g/dL — ABNORMAL LOW (ref 31.5–35.7)
MCV: 72 fL — ABNORMAL LOW (ref 79–97)
Monocytes Absolute: 1 x10E3/uL — ABNORMAL HIGH (ref 0.1–0.9)
Monocytes: 11 %
Neutrophils Absolute: 5.7 x10E3/uL (ref 1.4–7.0)
Neutrophils: 64 %
Platelets: 476 x10E3/uL — ABNORMAL HIGH (ref 150–450)
RBC: 4.55 x10E6/uL (ref 3.77–5.28)
RDW: 16.8 % — ABNORMAL HIGH (ref 11.7–15.4)
WBC: 9 x10E3/uL (ref 3.4–10.8)

## 2023-11-07 LAB — HEMOGLOBIN A1C
Est. average glucose Bld gHb Est-mCnc: 97 mg/dL
Hgb A1c MFr Bld: 5 % (ref 4.8–5.6)

## 2023-11-07 LAB — COMPREHENSIVE METABOLIC PANEL WITH GFR
ALT: 9 IU/L (ref 0–32)
AST: 14 IU/L (ref 0–40)
Albumin: 4 g/dL (ref 4.0–5.0)
Alkaline Phosphatase: 68 IU/L (ref 41–116)
BUN/Creatinine Ratio: 13 (ref 9–23)
BUN: 9 mg/dL (ref 6–20)
Bilirubin Total: 0.3 mg/dL (ref 0.0–1.2)
CO2: 23 mmol/L (ref 20–29)
Calcium: 9.6 mg/dL (ref 8.7–10.2)
Chloride: 101 mmol/L (ref 96–106)
Creatinine, Ser: 0.7 mg/dL (ref 0.57–1.00)
Globulin, Total: 3.7 g/dL (ref 1.5–4.5)
Glucose: 95 mg/dL (ref 70–99)
Potassium: 4.4 mmol/L (ref 3.5–5.2)
Sodium: 138 mmol/L (ref 134–144)
Total Protein: 7.7 g/dL (ref 6.0–8.5)
eGFR: 123 mL/min/1.73 (ref >59)

## 2023-11-07 LAB — TSH: TSH: 0.987 u[IU]/mL (ref 0.450–4.500)

## 2023-11-08 ENCOUNTER — Ambulatory Visit: Payer: Self-pay | Admitting: Physician Assistant

## 2023-11-09 ENCOUNTER — Encounter: Payer: Self-pay | Admitting: Physician Assistant

## 2023-11-20 ENCOUNTER — Ambulatory Visit

## 2023-11-20 NOTE — Progress Notes (Deleted)
      Established patient visit   Patient: Sharon Ball   DOB: [birth year]   27 y.o. Female  MRN: [id number] Visit Date: 11/20/2023  Today's healthcare provider: Isaiah DELENA Pepper, MD   No chief complaint on file.  Subjective    HPI  IDA:   Weight:    Medications: No outpatient medications prior to visit.   No facility-administered medications prior to visit.    Review of Systems as noted in HPI.  {Insert previous labs (optional):23779} {See past labs  Heme  Chem  Endocrine  Serology  Results Review (optional):1}   Objective    There were no vitals taken for this visit. {Insert last BP/Wt (optional):23777}{See vitals history (optional):1}  Physical Exam   No results found for any visits on 11/20/23.  Assessment & Plan     Problem List Items Addressed This Visit   None   Assessment and Plan               No follow-ups on file.       Isaiah DELENA Pepper, MD  [facility] [phone] (phone) [phone] (fax)
